# Patient Record
Sex: Female | Born: 1970 | Race: White | Hispanic: No | Marital: Married | Smoking: Never smoker
Health system: Southern US, Community
[De-identification: ages and names within clinical notes are randomized; demographics above are authoritative.]

---

## 1999-04-11 HISTORY — PX: GALLBLADDER SURGERY: SHX652

## 2012-02-20 ENCOUNTER — Ambulatory Visit: Payer: Self-pay | Admitting: Family Medicine

## 2012-02-20 LAB — LIPID PANEL
Cholesterol: 144 mg/dL (ref 0–200)
HDL Cholesterol: 46 mg/dL (ref 40–60)
Ldl Cholesterol, Calc: 84 mg/dL (ref 0–100)
Triglycerides: 72 mg/dL (ref 0–200)
VLDL Cholesterol, Calc: 14 mg/dL (ref 5–40)

## 2012-02-20 LAB — FOLATE: Folic Acid: 19.7 ng/mL (ref 3.1–100.0)

## 2012-02-20 LAB — FERRITIN: Ferritin (ARMC): 31 ng/mL (ref 8–388)

## 2015-01-13 DIAGNOSIS — Z833 Family history of diabetes mellitus: Secondary | ICD-10-CM | POA: Insufficient documentation

## 2015-01-13 DIAGNOSIS — Z789 Other specified health status: Secondary | ICD-10-CM | POA: Insufficient documentation

## 2015-01-14 ENCOUNTER — Encounter: Payer: Self-pay | Admitting: Family Medicine

## 2015-01-14 ENCOUNTER — Ambulatory Visit (INDEPENDENT_AMBULATORY_CARE_PROVIDER_SITE_OTHER): Payer: Managed Care, Other (non HMO) | Admitting: Family Medicine

## 2015-01-14 VITALS — BP 122/80 | HR 91 | Temp 98.6°F | Resp 16 | Ht 63.0 in | Wt 141.4 lb

## 2015-01-14 DIAGNOSIS — J301 Allergic rhinitis due to pollen: Secondary | ICD-10-CM

## 2015-01-14 DIAGNOSIS — J40 Bronchitis, not specified as acute or chronic: Secondary | ICD-10-CM

## 2015-01-14 DIAGNOSIS — J309 Allergic rhinitis, unspecified: Secondary | ICD-10-CM | POA: Insufficient documentation

## 2015-01-14 MED ORDER — CEFDINIR 300 MG PO CAPS: 300.0000 mg | ORAL_CAPSULE | Freq: Two times a day (BID) | ORAL | Status: DC

## 2015-01-14 NOTE — Progress Notes (Signed)
Subjective:     Patient ID: Colleen Cox, female   DOB: 1971/02/16, 44 y.o.   MRN: [id number]  HPI  Chief Complaint  Patient presents with  . Cough    Patient comes in office today with concerns of productive cough and sinus congestion. Patient reports symptoms have been greater than 2 weeks. Patient states that cough is productive of mucous and she feels a heaviness in her chest, patient reports upon standing fast she experiences visual changes. Patient has taken otc Zyrtec to relieve symptoms  States cough has become increasingly productive of thick yellowish sputum. Sinus drainage remains clear. Reports night sweats.   Review of Systems  Constitutional: Negative for fever and chills.       Objective:   Physical Exam  Constitutional: She appears well-developed and well-nourished. No distress.  Ears: T.M's intact without inflammation Throat: no tonsillar enlargement or exudate Neck: no cervical adenopathy Lungs: clear    Assessment:    1. Bronchitis - cefdinir (OMNICEF) 300 MG capsule; Take 1 capsule (300 mg total) by mouth 2 (two) times daily.  Dispense: 14 capsule; Refill: 0    Plan:    Discussed use of Delsym and Mucinex.

## 2015-01-14 NOTE — Patient Instructions (Signed)
Continue Mucinex and Delsym for cough. 

## 2015-07-15 ENCOUNTER — Ambulatory Visit (INDEPENDENT_AMBULATORY_CARE_PROVIDER_SITE_OTHER): Payer: 59 | Admitting: Physician Assistant

## 2015-07-15 ENCOUNTER — Encounter: Payer: Self-pay | Admitting: Physician Assistant

## 2015-07-15 VITALS — BP 98/60 | HR 72 | Temp 98.3°F | Resp 16 | Ht 62.0 in | Wt 140.4 lb

## 2015-07-15 DIAGNOSIS — Z833 Family history of diabetes mellitus: Secondary | ICD-10-CM

## 2015-07-15 DIAGNOSIS — Z1239 Encounter for other screening for malignant neoplasm of breast: Secondary | ICD-10-CM

## 2015-07-15 DIAGNOSIS — Z13220 Encounter for screening for lipoid disorders: Secondary | ICD-10-CM

## 2015-07-15 DIAGNOSIS — Z136 Encounter for screening for cardiovascular disorders: Secondary | ICD-10-CM | POA: Diagnosis not present

## 2015-07-15 DIAGNOSIS — R5383 Other fatigue: Secondary | ICD-10-CM

## 2015-07-15 DIAGNOSIS — Z0000 Encounter for general adult medical examination without abnormal findings: Secondary | ICD-10-CM | POA: Diagnosis not present

## 2015-07-15 DIAGNOSIS — Z124 Encounter for screening for malignant neoplasm of cervix: Secondary | ICD-10-CM | POA: Diagnosis not present

## 2015-07-15 NOTE — Patient Instructions (Signed)
Health Maintenance, Female Adopting a healthy lifestyle and getting preventive care can go a long way to promote health and wellness. Talk with your health care provider about what schedule of regular examinations is right for you. This is a good chance for you to check in with your provider about disease prevention and staying healthy. In between checkups, there are plenty of things you can do on your own. Experts have done a lot of research about which lifestyle changes and preventive measures are most likely to keep you healthy. Ask your health care provider for more information. WEIGHT AND DIET  Eat a healthy diet  Be sure to include plenty of vegetables, fruits, low-fat dairy products, and lean protein.  Do not eat a lot of foods high in solid fats, added sugars, or salt.  Get regular exercise. This is one of the most important things you can do for your health.  Most adults should exercise for at least 150 minutes each week. The exercise should increase your heart rate and make you sweat (moderate-intensity exercise).  Most adults should also do strengthening exercises at least twice a week. This is in addition to the moderate-intensity exercise.  Maintain a healthy weight  Body mass index (BMI) is a measurement that can be used to identify possible weight problems. It estimates body fat based on height and weight. Your health care provider can help determine your BMI and help you achieve or maintain a healthy weight.  For females 89 years of age and older:   A BMI below 18.5 is considered underweight.  A BMI of 18.5 to 24.9 is normal.  A BMI of 25 to 29.9 is considered overweight.  A BMI of 30 and above is considered obese.  Watch levels of cholesterol and blood lipids  You should start having your blood tested for lipids and cholesterol at 43 years of age, then have this test every 5 years.  You may need to have your cholesterol levels checked more often if:  Your lipid  or cholesterol levels are high.  You are older than 43 years of age.  You are at high risk for heart disease.  CANCER SCREENING   Lung Cancer  Lung cancer screening is recommended for adults 36-32 years old who are at high risk for lung cancer because of a history of smoking.  A yearly low-dose CT scan of the lungs is recommended for people who:  Currently smoke.  Have quit within the past 15 years.  Have at least a 30-pack-year history of smoking. A pack year is smoking an average of one pack of cigarettes a day for 1 year.  Yearly screening should continue until it has been 15 years since you quit.  Yearly screening should stop if you develop a health problem that would prevent you from having lung cancer treatment.  Breast Cancer  Practice breast self-awareness. This means understanding how your breasts normally appear and feel.  It also means doing regular breast self-exams. Let your health care provider know about any changes, no matter how small.  If you are in your 20s or 30s, you should have a clinical breast exam (CBE) by a health care provider every 1-3 years as part of a regular health exam.  If you are 75 or older, have a CBE every year. Also consider having a breast X-ray (mammogram) every year.  If you have a family history of breast cancer, talk to your health care provider about genetic screening.  If you  are at high risk for breast cancer, talk to your health care provider about having an MRI and a mammogram every year.  Breast cancer gene (BRCA) assessment is recommended for women who have family members with BRCA-related cancers. BRCA-related cancers include:  Breast.  Ovarian.  Tubal.  Peritoneal cancers.  Results of the assessment will determine the need for genetic counseling and BRCA1 and BRCA2 testing. Cervical Cancer Your health care provider may recommend that you be screened regularly for cancer of the pelvic organs (ovaries, uterus, and  vagina). This screening involves a pelvic examination, including checking for microscopic changes to the surface of your cervix (Pap test). You may be encouraged to have this screening done every 3 years, beginning at age 21.  For women ages 30-65, health care providers may recommend pelvic exams and Pap testing every 3 years, or they may recommend the Pap and pelvic exam, combined with testing for human papilloma virus (HPV), every 5 years. Some types of HPV increase your risk of cervical cancer. Testing for HPV may also be done on women of any age with unclear Pap test results.  Other health care providers may not recommend any screening for nonpregnant women who are considered low risk for pelvic cancer and who do not have symptoms. Ask your health care provider if a screening pelvic exam is right for you.  If you have had past treatment for cervical cancer or a condition that could lead to cancer, you need Pap tests and screening for cancer for at least 20 years after your treatment. If Pap tests have been discontinued, your risk factors (such as having a new sexual partner) need to be reassessed to determine if screening should resume. Some women have medical problems that increase the chance of getting cervical cancer. In these cases, your health care provider may recommend more frequent screening and Pap tests. Colorectal Cancer  This type of cancer can be detected and often prevented.  Routine colorectal cancer screening usually begins at 43 years of age and continues through 43 years of age.  Your health care provider may recommend screening at an earlier age if you have risk factors for colon cancer.  Your health care provider may also recommend using home test kits to check for hidden blood in the stool.  A small camera at the end of a tube can be used to examine your colon directly (sigmoidoscopy or colonoscopy). This is done to check for the earliest forms of colorectal  cancer.  Routine screening usually begins at age 50.  Direct examination of the colon should be repeated every 5-10 years through 43 years of age. However, you may need to be screened more often if early forms of precancerous polyps or small growths are found. Skin Cancer  Check your skin from head to toe regularly.  Tell your health care provider about any new moles or changes in moles, especially if there is a change in a mole's shape or color.  Also tell your health care provider if you have a mole that is larger than the size of a pencil eraser.  Always use sunscreen. Apply sunscreen liberally and repeatedly throughout the day.  Protect yourself by wearing long sleeves, pants, a wide-brimmed hat, and sunglasses whenever you are outside. HEART DISEASE, DIABETES, AND HIGH BLOOD PRESSURE   High blood pressure causes heart disease and increases the risk of stroke. High blood pressure is more likely to develop in:  People who have blood pressure in the high end   of the normal range (130-139/85-89 mm Hg).  People who are overweight or obese.  People who are African American.  If you are 66-47 years of age, have your blood pressure checked every 3-5 years. If you are 51 years of age or older, have your blood pressure checked every year. You should have your blood pressure measured twice--once when you are at a hospital or clinic, and once when you are not at a hospital or clinic. Record the average of the two measurements. To check your blood pressure when you are not at a hospital or clinic, you can use:  An automated blood pressure machine at a pharmacy.  A home blood pressure monitor.  If you are between 56 years and 80 years old, ask your health care provider if you should take aspirin to prevent strokes.  Have regular diabetes screenings. This involves taking a blood sample to check your fasting blood sugar level.  If you are at a normal weight and have a low risk for diabetes,  have this test once every three years after 43 years of age.  If you are overweight and have a high risk for diabetes, consider being tested at a younger age or more often. PREVENTING INFECTION  Hepatitis B  If you have a higher risk for hepatitis B, you should be screened for this virus. You are considered at high risk for hepatitis B if:  You were born in a country where hepatitis B is common. Ask your health care provider which countries are considered high risk.  Your parents were born in a high-risk country, and you have not been immunized against hepatitis B (hepatitis B vaccine).  You have HIV or AIDS.  You use needles to inject street drugs.  You live with someone who has hepatitis B.  You have had sex with someone who has hepatitis B.  You get hemodialysis treatment.  You take certain medicines for conditions, including cancer, organ transplantation, and autoimmune conditions. Hepatitis C  Blood testing is recommended for:  Everyone born from [birth year] through 1963.  Anyone with known risk factors for hepatitis C. Sexually transmitted infections (STIs)  You should be screened for sexually transmitted infections (STIs) including gonorrhea and chlamydia if:  You are sexually active and are younger than 43 years of age.  You are older than 43 years of age and your health care provider tells you that you are at risk for this type of infection.  Your sexual activity has changed since you were last screened and you are at an increased risk for chlamydia or gonorrhea. Ask your health care provider if you are at risk.  If you do not have HIV, but are at risk, it may be recommended that you take a prescription medicine daily to prevent HIV infection. This is called pre-exposure prophylaxis (PrEP). You are considered at risk if:  You are sexually active and do not regularly use condoms or know the HIV status of your partner(s).  You take drugs by injection.  You are sexually  active with a partner who has HIV. Talk with your health care provider about whether you are at high risk of being infected with HIV. If you choose to begin PrEP, you should first be tested for HIV. You should then be tested every 3 months for as long as you are taking PrEP.  PREGNANCY   If you are premenopausal and you may become pregnant, ask your health care provider about preconception counseling.  If you may  become pregnant, take 400 to 800 micrograms (mcg) of folic acid every day.  If you want to prevent pregnancy, talk to your health care provider about birth control (contraception). OSTEOPOROSIS AND MENOPAUSE   Osteoporosis is a disease in which the bones lose minerals and strength with aging. This can result in serious bone fractures. Your risk for osteoporosis can be identified using a bone density scan.  If you are 61 years of age or older, or if you are at risk for osteoporosis and fractures, ask your health care provider if you should be screened.  Ask your health care provider whether you should take a calcium or vitamin D supplement to lower your risk for osteoporosis.  Menopause may have certain physical symptoms and risks.  Hormone replacement therapy may reduce some of these symptoms and risks. Talk to your health care provider about whether hormone replacement therapy is right for you.  HOME CARE INSTRUCTIONS   Schedule regular health, dental, and eye exams.  Stay current with your immunizations.   Do not use any tobacco products including cigarettes, chewing tobacco, or electronic cigarettes.  If you are pregnant, do not drink alcohol.  If you are breastfeeding, limit how much and how often you drink alcohol.  Limit alcohol intake to no more than 1 drink per day for nonpregnant women. One drink equals 12 ounces of beer, 5 ounces of wine, or 1 ounces of hard liquor.  Do not use street drugs.  Do not share needles.  Ask your health care provider for help if  you need support or information about quitting drugs.  Tell your health care provider if you often feel depressed.  Tell your health care provider if you have ever been abused or do not feel safe at home.   This information is not intended to replace advice given to you by your health care provider. Make sure you discuss any questions you have with your health care provider.   Document Released: 10/10/2010 Document Revised: 04/17/2014 Document Reviewed: 02/26/2013 Elsevier Interactive Patient Education [employer].

## 2015-07-15 NOTE — Progress Notes (Signed)
Patient: Colleen Cox, Female    DOB: 1970/11/11, 45 y.o.   MRN: [id number] Visit Date: 07/15/2015  Today's Provider: Margaretann Loveless, PA-C   Chief Complaint  Patient presents with  . Annual Exam   Subjective:    Annual physical exam Colleen Cox is a 45 y.o. female who presents today for health maintenance and complete physical. She feels fairly well, she is having her seasonal allergies. She reports exercising- very active, "walks her dog a lot.. She reports she is sleeping well. She reports she had her mammogram about a year ago at [facility]. She needs a physical form to be fill out for her insurance.  Pap: ? 2015-[facility]-Normal Mammogram: 2016 [facility]- Normal; No family h/o breast cancer Colonoscopy: N/A Tdap: 07/19/2009 -----------------------------------------------------------------   Review of Systems  Constitutional: Negative.   HENT: Positive for congestion, rhinorrhea, sinus pressure and sneezing.   Eyes: Positive for itching.  Respiratory: Positive for cough.   Cardiovascular: Negative.   Gastrointestinal: Negative.   Endocrine: Negative.   Genitourinary: Negative.   Musculoskeletal: Negative.   Skin: Negative.   Allergic/Immunologic: Positive for environmental allergies.  Neurological: Positive for headaches.  Hematological: Negative.   Psychiatric/Behavioral: Negative.     Social History      She  reports that she has never smoked. She has never used smokeless tobacco. She reports that she drinks alcohol. She reports that she does not use illicit drugs.       Social History   Social History  . Marital Status: Married    Spouse Name: N/A  . Number of Children: N/A  . Years of Education: N/A   Social History Main Topics  . Smoking status: Never Smoker   . Smokeless tobacco: Never Used  . Alcohol Use: 0.0 oz/week    0 Standard drinks or equivalent per week     Comment: 1 glass of wine once a week  . Drug Use: No  .  Sexual Activity: Not Asked   Other Topics Concern  . None   Social History Narrative    History reviewed. No pertinent past medical history.   Patient Active Problem List   Diagnosis Date Noted  . Allergic rhinitis 01/14/2015  . Family history of diabetes mellitus 01/13/2015    Past Surgical History  Procedure Laterality Date  . Gallbladder surgery  2001    Family History        Family Status  Relation Status Death Age  . Mother Alive   . Father Alive         Her family history includes Cirrhosis in her mother; Diabetes in her father and mother; Fibromyalgia in her mother; Heart attack in her father; Heart disease in her father and mother; Liver disease in her mother.    No Known Allergies  Previous Medications   CEFDINIR (OMNICEF) 300 MG CAPSULE    Take 1 capsule (300 mg total) by mouth 2 (two) times daily.   CETIRIZINE (ZYRTEC) 10 MG TABLET    Take 10 mg by mouth daily.   CHOLECALCIFEROL 1000 UNITS TABLET    Take by mouth.   FLUTICASONE PROPIONATE (FLONASE NA)    Place 1 spray into the nose as needed.   MULTIPLE VITAMIN PO    Take by mouth.    Patient Care Team: Anola Gurney, [place] as PCP - General (Family Medicine)     Objective:   Vitals: BP 98/60 mmHg  Pulse 72  Temp(Src) 98.3 F (  36.8 C) (Oral)  Resp 16  Ht 5\' 2"  (1.575 m)  Wt 140 lb 6.4 oz (63.685 kg)  BMI 25.67 kg/m2   Physical Exam  Constitutional: She is oriented to person, place, and time. She appears well-developed and well-nourished. No distress.  HENT:  Head: Normocephalic and atraumatic.  Right Ear: Hearing, tympanic membrane, external ear and ear canal normal.  Left Ear: Hearing, tympanic membrane, external ear and ear canal normal.  Nose: Nose normal.  Mouth/Throat: Uvula is midline, oropharynx is clear and moist and mucous membranes are normal. No oropharyngeal exudate.  Eyes: Conjunctivae and EOM are normal. Pupils are equal, round, and reactive to light. Right eye exhibits no  discharge. Left eye exhibits no discharge. No scleral icterus.  Neck: Normal range of motion. Neck supple. No JVD present. Carotid bruit is not present. No tracheal deviation present. No thyromegaly present.  Cardiovascular: Normal rate, regular rhythm, normal heart sounds and intact distal pulses.  Exam reveals no gallop and no friction rub.   No murmur heard. Pulmonary/Chest: Effort normal and breath sounds normal. No respiratory distress. She has no wheezes. She has no rales. She exhibits no tenderness. Right breast exhibits no inverted nipple, no mass, no nipple discharge, no skin change and no tenderness. Left breast exhibits no inverted nipple, no mass, no nipple discharge, no skin change and no tenderness. Breasts are symmetrical.  Abdominal: Soft. Bowel sounds are normal. She exhibits no distension and no mass. There is no tenderness. There is no rebound and no guarding. Hernia confirmed negative in the right inguinal area and confirmed negative in the left inguinal area.  Genitourinary: Rectum normal, vagina normal and uterus normal. No breast swelling, tenderness, discharge or bleeding. Pelvic exam was performed with patient supine. There is no rash, tenderness, lesion or injury on the right labia. There is no rash, tenderness, lesion or injury on the left labia. Cervix exhibits no motion tenderness, no discharge and no friability. Right adnexum displays no mass, no tenderness and no fullness. Left adnexum displays no mass, no tenderness and no fullness. No erythema, tenderness or bleeding in the vagina. No signs of injury around the vagina. No vaginal discharge found.  Musculoskeletal: Normal range of motion. She exhibits no edema or tenderness.  Lymphadenopathy:    She has no cervical adenopathy.       Right: No inguinal adenopathy present.       Left: No inguinal adenopathy present.  Neurological: She is alert and oriented to person, place, and time. She has normal reflexes. No cranial nerve  deficit. Coordination normal.  Skin: Skin is warm and dry. No rash noted. She is not diaphoretic.  Psychiatric: She has a normal mood and affect. Her behavior is normal. Judgment and thought content normal.  Vitals reviewed.    Depression Screen No flowsheet data found.    Assessment & Plan:     Routine Health Maintenance and Physical Exam 1. Annual physical exam Normal physical exam today. Will check labs as below and f/u pending lab results. If labs are stable and WNL does not need repeated for 1 year at next annual physical exam. She is to call if she has any acute issue, question or concern in the meantime. - CBC with Differential/Platelet - Comprehensive metabolic panel - TSH  2. Breast cancer screening Patient prefers [facility] instead. - [facility] DIGITAL SCREENING BILATERAL; Future  3. Cervical cancer screening Pap collected today. Will f/u pending results. - Pap IG w/ reflex to HPV when ASC-U  4. Encounter for lipid screening for cardiovascular disease Will check cholesterol as below and f/u pending results. - Lipid panel  5. Family history of diabetes mellitus Will check HgBA1c as below and f/u pending results. - Hemoglobin A1c  6. Other fatigue Will check labs as below and f/u pending results. - Magnesium - Vitamin D (25 hydroxy)   Exercise Activities and Dietary recommendations Goals    None       There is no immunization history on file for this patient.  Health Maintenance  Topic Date Due  . HIV Screening  10/11/1987  . TETANUS/TDAP  10/11/1991  . PAP SMEAR  10/10/1993  . INFLUENZA VACCINE  11/09/2015      Discussed health benefits of physical activity, and encouraged her to engage in regular exercise appropriate for her age and condition.    --------------------------------------------------------------------

## 2015-07-16 ENCOUNTER — Telehealth: Payer: Self-pay

## 2015-07-16 LAB — LIPID PANEL
CHOL/HDL RATIO: 3.6 ratio (ref 0.0–4.4)
Cholesterol, Total: 194 mg/dL (ref 100–199)
HDL: 54 mg/dL (ref >39)
LDL CALC: 123 mg/dL — AB (ref 0–99)
Triglycerides: 85 mg/dL (ref 0–149)
VLDL Cholesterol Cal: 17 mg/dL (ref 5–40)

## 2015-07-16 LAB — COMPREHENSIVE METABOLIC PANEL
ALK PHOS: 54 IU/L (ref 39–117)
ALT: 25 IU/L (ref 0–32)
AST: 20 IU/L (ref 0–40)
Albumin/Globulin Ratio: 2 (ref 1.2–2.2)
Albumin: 4.5 g/dL (ref 3.5–5.5)
BUN/Creatinine Ratio: 12 (ref 9–23)
BUN: 8 mg/dL (ref 6–24)
Bilirubin Total: 0.4 mg/dL (ref 0.0–1.2)
CO2: 27 mmol/L (ref 18–29)
CREATININE: 0.69 mg/dL (ref 0.57–1.00)
Calcium: 9.3 mg/dL (ref 8.7–10.2)
Chloride: 101 mmol/L (ref 96–106)
GFR calc Af Amer: 124 mL/min/{1.73_m2} (ref >59)
GFR calc non Af Amer: 108 mL/min/{1.73_m2} (ref >59)
Globulin, Total: 2.3 g/dL (ref 1.5–4.5)
Glucose: 100 mg/dL — ABNORMAL HIGH (ref 65–99)
Potassium: 4.7 mmol/L (ref 3.5–5.2)
Sodium: 141 mmol/L (ref 134–144)
Total Protein: 6.8 g/dL (ref 6.0–8.5)

## 2015-07-16 LAB — VITAMIN D 25 HYDROXY (VIT D DEFICIENCY, FRACTURES): VIT D 25 HYDROXY: 27.1 ng/mL — AB (ref 30.0–100.0)

## 2015-07-16 LAB — MAGNESIUM: MAGNESIUM: 2.1 mg/dL (ref 1.6–2.3)

## 2015-07-16 LAB — CBC WITH DIFFERENTIAL/PLATELET
BASOS ABS: 0 10*3/uL (ref 0.0–0.2)
Basos: 0 %
EOS (ABSOLUTE): 0.1 10*3/uL (ref 0.0–0.4)
Eos: 1 %
Hematocrit: 43.6 % (ref 34.0–46.6)
Hemoglobin: 15.4 g/dL (ref 11.1–15.9)
IMMATURE GRANULOCYTES: 0 %
Immature Grans (Abs): 0 10*3/uL (ref 0.0–0.1)
LYMPHS ABS: 2.4 10*3/uL (ref 0.7–3.1)
Lymphs: 40 %
MCH: 31.6 pg (ref 26.6–33.0)
MCHC: 35.3 g/dL (ref 31.5–35.7)
MCV: 90 fL (ref 79–97)
Monocytes Absolute: 0.5 10*3/uL (ref 0.1–0.9)
Monocytes: 9 %
Neutrophils Absolute: 2.8 10*3/uL (ref 1.4–7.0)
Neutrophils: 50 %
PLATELETS: 301 10*3/uL (ref 150–379)
RBC: 4.87 x10E6/uL (ref 3.77–5.28)
RDW: 12.9 % (ref 12.3–15.4)
WBC: 5.8 10*3/uL (ref 3.4–10.8)

## 2015-07-16 LAB — TSH: TSH: 1.65 u[IU]/mL (ref 0.450–4.500)

## 2015-07-16 LAB — HEMOGLOBIN A1C
Est. average glucose Bld gHb Est-mCnc: 105 mg/dL
HEMOGLOBIN A1C: 5.3 % (ref 4.8–5.6)

## 2015-07-16 NOTE — Telephone Encounter (Signed)
-----   Message from Margaretann LovelessJennifer M Burnette, [place]PA-C sent at 07/16/2015 11:04 AM EDT ----- All labs are within normal limits and stable, with exception of Vit D which is 27.1.  Taking a daily vit d supp of 1000-2000 IU daily should help with that. Also being in sunlight without sunscreen in early morning or later evening hours can boost vit d as well. Magnesium level is normal. Will recheck in one year. Biometric form for work was filled out and faxed also.Thanks! -JB

## 2015-07-16 NOTE — Telephone Encounter (Signed)
LM to call back.  Thanks,  -Joseline 

## 2015-07-16 NOTE — Telephone Encounter (Signed)
Advised patient as below.  

## 2015-07-20 LAB — PAP IG W/ RFLX HPV ASCU: PAP SMEAR COMMENT: 0

## 2015-07-21 ENCOUNTER — Telehealth: Payer: Self-pay

## 2015-07-21 NOTE — Telephone Encounter (Signed)
Patient advised as directed below. 

## 2015-07-21 NOTE — Telephone Encounter (Signed)
LMTCB

## 2015-07-21 NOTE — Telephone Encounter (Signed)
-----   Message from Margaretann LovelessJennifer M Burnette, [place]PA-C sent at 07/21/2015  8:28 AM EDT ----- Pap is normal.  Will repeat in 3 years.

## 2015-07-23 ENCOUNTER — Encounter: Payer: Self-pay | Admitting: Family Medicine

## 2015-07-23 ENCOUNTER — Encounter: Payer: Self-pay | Admitting: Physician Assistant

## 2015-09-01 ENCOUNTER — Ambulatory Visit (INDEPENDENT_AMBULATORY_CARE_PROVIDER_SITE_OTHER): Payer: 59 | Admitting: Physician Assistant

## 2015-09-01 ENCOUNTER — Encounter: Payer: Self-pay | Admitting: Physician Assistant

## 2015-09-01 VITALS — BP 122/80 | HR 85 | Temp 98.2°F | Resp 16 | Wt 140.8 lb

## 2015-09-01 DIAGNOSIS — J40 Bronchitis, not specified as acute or chronic: Secondary | ICD-10-CM | POA: Diagnosis not present

## 2015-09-01 DIAGNOSIS — R059 Cough, unspecified: Secondary | ICD-10-CM

## 2015-09-01 DIAGNOSIS — R05 Cough: Secondary | ICD-10-CM

## 2015-09-01 MED ORDER — AZITHROMYCIN 250 MG PO TABS: ORAL_TABLET | ORAL | Status: DC

## 2015-09-01 MED ORDER — PREDNISONE 10 MG PO TABS: ORAL_TABLET | ORAL | Status: DC

## 2015-09-01 MED ORDER — HYDROCODONE-HOMATROPINE 5-1.5 MG/5ML PO SYRP: 5.0000 mL | ORAL_SOLUTION | Freq: Three times a day (TID) | ORAL | Status: DC | PRN

## 2015-09-01 NOTE — Progress Notes (Signed)
Patient: Colleen Cox Female    DOB: [birth year]   45 y.o.   MRN: [id number] Visit Date: 09/01/2015  Today's Provider: Margaretann Loveless, PA-C   Chief Complaint  Patient presents with  . Cough   Subjective:    Cough This is a new problem. Episode onset: She reports she went on vacaton 04/28 and had like the flu symptoms but the cough is still present. The problem has been gradually worsening. The problem occurs constantly. The cough is non-productive. Associated symptoms include postnasal drip, rhinorrhea and shortness of breath (when she is lying in bed and walking her dog). Pertinent negatives include no chest pain, chills, ear pain, fever, headaches, nasal congestion, sore throat or wheezing. Associated symptoms comments: Upper back pain . The symptoms are aggravated by lying down (going up the stairs she feels very weak. Fatigue). Treatments tried: Flonase and Zyrtec, mucinex. The treatment provided mild relief.       No Known Allergies Previous Medications   CEFDINIR (OMNICEF) 300 MG CAPSULE    Take 1 capsule (300 mg total) by mouth 2 (two) times daily.   CETIRIZINE (ZYRTEC) 10 MG TABLET    Take 10 mg by mouth daily.   CHOLECALCIFEROL 1000 UNITS TABLET    Take by mouth.   FLUTICASONE PROPIONATE (FLONASE NA)    Place 1 spray into the nose as needed.   MULTIPLE VITAMIN PO    Take by mouth.    Review of Systems  Constitutional: Positive for fatigue. Negative for fever and chills.  HENT: Positive for congestion, postnasal drip and rhinorrhea. Negative for ear pain, sinus pressure and sore throat.   Respiratory: Positive for cough and shortness of breath (when she is lying in bed and walking her dog). Negative for wheezing.   Cardiovascular: Negative for chest pain, palpitations and leg swelling.  Gastrointestinal: Negative for nausea, vomiting and abdominal pain.  Neurological: Negative for dizziness and headaches.    Social History  Substance Use Topics  . Smoking  status: Never Smoker   . Smokeless tobacco: Never Used  . Alcohol Use: 0.0 oz/week    0 Standard drinks or equivalent per week     Comment: 1 glass of wine once a week   Objective:   BP 122/80 mmHg  Pulse 85  Temp(Src) 98.2 F (36.8 C) (Oral)  Resp 16  Wt 140 lb 12.8 oz (63.866 kg)  SpO2 96%  LMP 08/06/2015  Physical Exam  Constitutional: She appears well-developed and well-nourished. No distress.  HENT:  Head: Normocephalic and atraumatic.  Right Ear: Hearing, tympanic membrane, external ear and ear canal normal.  Left Ear: Hearing, tympanic membrane, external ear and ear canal normal.  Nose: Mucosal edema and rhinorrhea present. Right sinus exhibits no maxillary sinus tenderness and no frontal sinus tenderness. Left sinus exhibits no maxillary sinus tenderness and no frontal sinus tenderness.  Mouth/Throat: Uvula is midline and mucous membranes are normal. Posterior oropharyngeal erythema present. No oropharyngeal exudate or posterior oropharyngeal edema.  Eyes: Conjunctivae are normal. Pupils are equal, round, and reactive to light. Right eye exhibits no discharge. Left eye exhibits no discharge. No scleral icterus.  Neck: Normal range of motion. Neck supple. No tracheal deviation present. No thyromegaly present.  Cardiovascular: Normal rate, regular rhythm and normal heart sounds.  Exam reveals no gallop and no friction rub.   No murmur heard. Pulmonary/Chest: Effort normal. No stridor. No respiratory distress. She has wheezes (throughout). She has no rales.  Lymphadenopathy:  She has no cervical adenopathy.  Skin: Skin is warm and dry. She is not diaphoretic.  Vitals reviewed.       Assessment & Plan:     1. Bronchitis Worsening symptoms that have not responded to over-the-counter medications over 2 weeks. I will give her azithromycin and prednisone taper as below for treatment. I will also give Hycodan cough syrup for nighttime cough. I did advise of drowsiness  precautions. She is to continue the Mucinex during the day for congestion. She is to stay well-hydrated and try to get plenty of rest. She is to call the office if symptoms fail to improve or worsen. - azithromycin (ZITHROMAX) 250 MG tablet; Take 2 tablets PO on day one, and one tablet PO daily thereafter until completed.  Dispense: 6 tablet; Refill: 0 - predniSONE (DELTASONE) 10 MG tablet; Take 6 tabs PO on day 1&2, 5 tabs PO on day 3&4, 4 tabs PO on day 5&6, 3 tabs PO on day 7&8, 2 tabs PO on day 9&10, 1 tab PO on day 11&12.  Dispense: 42 tablet; Refill: 0  2. Cough See above medical treatment plan. - HYDROcodone-homatropine (HYCODAN) 5-1.5 MG/5ML syrup; Take 5 mLs by mouth every 8 (eight) hours as needed.  Dispense: 180 mL; Refill: 0       Margaretann LovelessJennifer M Twila Rappa, PA-C  [facility]

## 2015-09-01 NOTE — Patient Instructions (Signed)

## 2015-09-03 ENCOUNTER — Ambulatory Visit: Payer: 59 | Admitting: Family Medicine

## 2016-02-02 ENCOUNTER — Encounter: Payer: Self-pay | Admitting: Physician Assistant

## 2016-02-02 ENCOUNTER — Ambulatory Visit (INDEPENDENT_AMBULATORY_CARE_PROVIDER_SITE_OTHER): Payer: 59 | Admitting: Physician Assistant

## 2016-02-02 VITALS — BP 122/80 | HR 84 | Temp 98.0°F | Resp 16 | Wt 140.0 lb

## 2016-02-02 DIAGNOSIS — R350 Frequency of micturition: Secondary | ICD-10-CM

## 2016-02-02 DIAGNOSIS — R05 Cough: Secondary | ICD-10-CM | POA: Diagnosis not present

## 2016-02-02 DIAGNOSIS — R059 Cough, unspecified: Secondary | ICD-10-CM

## 2016-02-02 LAB — POCT URINALYSIS DIPSTICK
Bilirubin, UA: NEGATIVE
Blood, UA: NEGATIVE
Glucose, UA: NEGATIVE
Ketones, UA: NEGATIVE
Leukocytes, UA: NEGATIVE
Nitrite, UA: NEGATIVE
Protein, UA: NEGATIVE
Spec Grav, UA: >=1.03
Urobilinogen, UA: 0.2
pH, UA: 5

## 2016-02-02 LAB — POCT GLYCOSYLATED HEMOGLOBIN (HGB A1C): Hemoglobin A1C: 5.1

## 2016-02-02 MED ORDER — HYDROCODONE-HOMATROPINE 5-1.5 MG/5ML PO SYRP: 5.0000 mL | ORAL_SOLUTION | Freq: Every day | ORAL | 0 refills | Status: DC

## 2016-02-02 NOTE — Addendum Note (Signed)
Addended by: Kavin LeechWALSH, LAURA E on: 02/02/2016 09:17 AM   Modules accepted: Orders

## 2016-02-02 NOTE — Patient Instructions (Signed)
Urinary Frequency °The number of times a normal person urinates depends upon how much liquid they take in and how much liquid they are losing. If the temperature is hot and there is high humidity, then the person will sweat more and usually breathe a little more frequently. These factors decrease the amount of frequency of urination that would be considered normal. °The amount you drink is easily determined, but the amount of fluid lost is sometimes more difficult to calculate.  °Fluid is lost in two ways: °· Sensible fluid loss is usually measured by the amount of urine that you get rid of. Losses of fluid can also occur with diarrhea. °· Insensible fluid loss is more difficult to measure. It is caused by evaporation. Insensible loss of fluid occurs through breathing and sweating. It usually ranges from a little less than a quart to a little more than a quart of fluid a day. °In normal temperatures and activity levels, the average person may urinate 4 to 7 times in a 24-hour period. Needing to urinate more often than that could indicate a problem. If one urinates 4 to 7 times in 24 hours and has large volumes each time, that could indicate a different problem from one who urinates 4 to 7 times a day and has small volumes. The time of urinating is also important. Most urinating should be done during the waking hours. Getting up at night to urinate frequently can indicate some problems. °CAUSES  °The bladder is the organ in your lower abdomen that holds urine. Like a balloon, it swells some as it fills up. Your nerves sense this and tell you it is time to head for the bathroom. There are a number of reasons that you might feel the need to urinate more often than usual. They include: °· Urinary tract infection. This is usually associated with other signs such as burning when you urinate. °· In men, problems with the prostate (a walnut-size gland that is located near the tube that carries urine out of your body). There  are two reasons why the prostate can cause an increased frequency of urination: °¨ An enlarged prostate that does not let the bladder empty well. If the bladder only half empties when you urinate, then it only has half the capacity to fill before you have to urinate again. °¨ The nerves in the bladder become more hypersensitive with an increased size of the prostate even if the bladder empties completely. °· Pregnancy. °· Obesity. Excess weight is more likely to cause a problem for women than for men. °· Bladder stones or other bladder problems. °· Caffeine. °· Alcohol. °· Medications. For example, drugs that help the body get rid of extra fluid (diuretics) increase urine production. Some other medicines must be taken with lots of fluids. °· Muscle or nerve weakness. This might be the result of a spinal cord injury, a stroke, multiple sclerosis, or Parkinson disease. °· Long-standing diabetes can decrease the sensation of the bladder. This loss of sensation makes it harder to sense the bladder needs to be emptied. Over a period of years, the bladder is stretched out by constant overfilling. This weakens the bladder muscles so that the bladder does not empty well and has less capacity to fill with new urine. °· Interstitial cystitis (also called painful bladder syndrome). This condition develops because the tissues that line the inside of the bladder are inflamed (inflammation is the body's way of reacting to injury or infection). It causes pain and frequent   urination. It occurs in women more often than in men. °DIAGNOSIS  °· To decide what might be causing your urinary frequency, your health care provider will probably: °¨ Ask about symptoms you have noticed. °¨ Ask about your overall health. This will include questions about any medications you are taking. °¨ Do a physical examination. °· Order some tests. These might include: °¨ A blood test to check for diabetes or other health issues that could be contributing  to the problem. °¨ Urine testing. This could measure the flow of urine and the pressure on the bladder. °¨ A test of your neurological system (the brain, spinal cord, and nerves). This is the system that senses the need to urinate. °¨ A bladder test to check whether it is emptying completely when you urinate. °¨ Cystoscopy. This test uses a thin tube with a tiny camera on it. It offers a look inside your urethra and bladder to see if there are problems. °¨ Imaging tests. You might be given a contrast dye and then asked to urinate. X-rays are taken to see how your bladder is working. °TREATMENT  °It is important for you to be evaluated to determine if the amount or frequency that you have is unusual or abnormal. If it is found to be abnormal, the cause should be determined and this can usually be found out easily. Depending upon the cause, treatment could include medication, stimulation of the nerves, or surgery. °There are not too many things that you can do as an individual to change your urinary frequency. It is important that you balance the amount of fluid intake needed to compensate for your activity and the temperature. Medical problems will be diagnosed and taken care of by your physician. There is no particular bladder training such as Kegel exercises that you can do to help urinary frequency. This is an exercise that is usually recommended for people who have leaking of urine when they laugh, cough, or sneeze. °HOME CARE INSTRUCTIONS  °· Take any medications your health care provider prescribed or suggested. Follow the directions carefully. °· Practice any lifestyle changes that are recommended. These might include: °¨ Drinking less fluid or drinking at different times of the day. If you need to urinate often during the night, for example, you may need to stop drinking fluids early in the evening. °¨ Cutting down on caffeine or alcohol. They both can make you need to urinate more often than normal. Caffeine  is found in coffee, tea, and sodas. °¨ Losing weight, if that is recommended. °· Keep a journal or a log. You might be asked to record how much you drink and when and where you feel the need to urinate. This will also help evaluate how well the treatment provided by your physician is working. °SEEK MEDICAL CARE IF:  °· Your need to urinate often gets worse. °· You feel increased pain or irritation when you urinate. °· You notice blood in your urine. °· You have questions about any medications that your health care provider recommended. °· You notice blood, pus, or swelling at the site of any test or treatment procedure. °· You develop a fever of more than 100.5°F (38.1°C). °SEEK IMMEDIATE MEDICAL CARE IF:  °You develop a fever of more than 102.0°F (38.9°C). °  °This information is not intended to replace advice given to you by your health care provider. Make sure you discuss any questions you have with your health care provider. °  °Document Released: 01/21/2009 Document Revised:   04/17/2014 Document Reviewed: 01/21/2009 °[employer] Interactive Patient Education ©2016 [employer] ° °

## 2016-02-02 NOTE — Progress Notes (Signed)
[facility]  Chief Complaint  Patient presents with  . Urinary Tract Infection    Started about three weeks ago.     Subjective:    Patient ID: Colleen Cox, female    DOB: 01-Jan-1971, 45 y.o.   MRN: [id number]   Urinary Tract Infection: Patient complains of dysuria, frequency, hesitancy and incontinence She has had symptoms for 3 weeks. Patient also complains of congestion and cough. Patient denies fever. Patient does have a history of recurrent UTI.  Patient does not have a history of pyelonephritis or other renal issues. Patient reports vaginal discharge but only a small amount and denies new sexual partners. The patient denies recent travel outside of the [place]. Has urge to go to bathroom every hour or every 2 hours with incomplete emptying. No blood in urine. Never smoker. One sexual partner for 23 years, no concerns for STD. One 20 year old child. No feeling of fullness. Patient experiences some incontinence at night when coughing.    Upper Respiratory Infection: Patient complains of symptoms of a URI. Symptoms include cough and sore throat. Onset of symptoms was 2 weeks ago, gradually worsening since that time. She also c/o cough described as productive for the past 2 weeks .  She is drinking plenty of fluids. Evaluation to date: none. Treatment to date: Zyrtec. Patient has history of allergies. No fevers, malaise, myalgias.     Review of Systems  Constitutional: Positive for chills and malaise/fatigue. Negative for diaphoresis, fever and weight loss.  HENT: Positive for congestion and sore throat. Negative for ear discharge, ear pain, hearing loss, nosebleeds and tinnitus.   Eyes: Positive for discharge. Negative for blurred vision, double vision, photophobia, pain and redness.  Respiratory: Positive for cough, sputum production, shortness of breath and wheezing. Negative for hemoptysis.   Cardiovascular: Negative.   Gastrointestinal: Negative.    Genitourinary: Positive for dysuria, frequency and urgency. Negative for flank pain and hematuria.  Neurological: Positive for headaches. Negative for weakness.  Endo/Heme/Allergies: Positive for environmental allergies.       Objective:   BP 122/80 (BP Location: Left Arm, Patient Position: Sitting, Cuff Size: Normal)   Pulse 84   Temp 98 F (36.7 C) (Oral)   Resp 16   Wt 140 lb (63.5 kg)   LMP 01/12/2016   BMI 25.61 kg/m   Patient Active Problem List   Diagnosis Date Noted  . Allergic rhinitis 01/14/2015  . Family history of diabetes mellitus 01/13/2015    Outpatient Encounter Prescriptions as of 02/02/2016  Medication Sig Note  . cetirizine (ZYRTEC) 10 MG tablet Take 10 mg by mouth daily.   . Cholecalciferol 1000 UNITS tablet Take by mouth. 01/13/2015: Received from: [facility]  . MULTIPLE VITAMIN PO Take by mouth. 01/13/2015: Received from: [facility]  . Fluticasone Propionate (FLONASE NA) Place 1 spray into the nose as needed.   Marland Kitchen HYDROcodone-homatropine (HYCODAN) 5-1.5 MG/5ML syrup Take 5 mLs by mouth at bedtime.   . [DISCONTINUED] azithromycin (ZITHROMAX) 250 MG tablet Take 2 tablets PO on day one, and one tablet PO daily thereafter until completed.   . [DISCONTINUED] HYDROcodone-homatropine (HYCODAN) 5-1.5 MG/5ML syrup Take 5 mLs by mouth every 8 (eight) hours as needed.   . [DISCONTINUED] predniSONE (DELTASONE) 10 MG tablet Take 6 tabs PO on day 1&2, 5 tabs PO on day 3&4, 4 tabs PO on day 5&6, 3 tabs PO on day 7&8, 2 tabs PO on day 9&10, 1 tab PO on day 11&12.  No facility-administered encounter medications on file as of 02/02/2016.     No Known Allergies     Physical Exam  Constitutional: She is oriented to person, place, and time. She appears well-developed and well-nourished. No distress.  HENT:  Right Ear: External ear normal.  Left Ear: External ear normal.  Nose: Nose normal. Right sinus exhibits no maxillary sinus  tenderness and no frontal sinus tenderness. Left sinus exhibits no maxillary sinus tenderness and no frontal sinus tenderness.  Mouth/Throat: Oropharynx is clear and moist. No oropharyngeal exudate.  Eyes: Conjunctivae are normal.  Neck: Normal range of motion. Neck supple.  Cardiovascular: Normal rate and regular rhythm.   Pulmonary/Chest: Effort normal and breath sounds normal. No respiratory distress. She has no wheezes. She has no rales. She exhibits no tenderness.  Abdominal: Soft. Bowel sounds are normal. She exhibits no distension. There is no tenderness. There is no rebound, no guarding and no CVA tenderness.  Lymphadenopathy:    She has no cervical adenopathy.  Neurological: She is alert and oriented to person, place, and time.  Skin: Skin is warm and dry. She is not diaphoretic.  Psychiatric: She has a normal mood and affect. Her behavior is normal.       Assessment & Plan:   Problem List Items Addressed This Visit    None    Visit Diagnoses    Cough    -  Primary   Relevant Medications   HYDROcodone-homatropine (HYCODAN) 5-1.5 MG/5ML syrup   Urinary frequency          1. Ordered in office urinalysis with culture.   Urinalysis No results found for: COLORURINE, APPEARANCEUR, LABSPEC, PHURINE, GLUCOSEU, HGBUR, BILIRUBINUR, KETONESUR, PROTEINUR, UROBILINOGEN, NITRITE, LEUKOCYTESUR  2. No signs of infection on urinalysis. Will await culture. No antibiotics for now. Advised patient that she is unlikely as a premenopausal woman to have vaginal atrophy causing irritative symptoms. Low suspicion for any sort of prolapse or STI. Counseled patient on referral to urology if symptoms persist. Patient has family history of diabetes. POCT A1C in office today was normal at 5.1. Will prescribe cough syrup at night for URI.    3. Patient should report back to clinic or to emergency room should they experience extreme low back pain, fever, nausea, vomiting.   Patient Instructions   Urinary Frequency The number of times a normal person urinates depends upon how much liquid they take in and how much liquid they are losing. If the temperature is hot and there is high humidity, then the person will sweat more and usually breathe a little more frequently. These factors decrease the amount of frequency of urination that would be considered normal. The amount you drink is easily determined, but the amount of fluid lost is sometimes more difficult to calculate.  Fluid is lost in two ways:  Sensible fluid loss is usually measured by the amount of urine that you get rid of. Losses of fluid can also occur with diarrhea.  Insensible fluid loss is more difficult to measure. It is caused by evaporation. Insensible loss of fluid occurs through breathing and sweating. It usually ranges from a little less than a quart to a little more than a quart of fluid a day. In normal temperatures and activity levels, the average person may urinate 4 to 7 times in a 24-hour period. Needing to urinate more often than that could indicate a problem. If one urinates 4 to 7 times in 24 hours and has large volumes each time, that  could indicate a different problem from one who urinates 4 to 7 times a day and has small volumes. The time of urinating is also important. Most urinating should be done during the waking hours. Getting up at night to urinate frequently can indicate some problems. CAUSES  The bladder is the organ in your lower abdomen that holds urine. Like a balloon, it swells some as it fills up. Your nerves sense this and tell you it is time to head for the bathroom. There are a number of reasons that you might feel the need to urinate more often than usual. They include:  Urinary tract infection. This is usually associated with other signs such as burning when you urinate.  In men, problems with the prostate (a walnut-size gland that is located near the tube that carries urine out of your body).  There are two reasons why the prostate can cause an increased frequency of urination:  An enlarged prostate that does not let the bladder empty well. If the bladder only half empties when you urinate, then it only has half the capacity to fill before you have to urinate again.  The nerves in the bladder become more hypersensitive with an increased size of the prostate even if the bladder empties completely.  Pregnancy.  Obesity. Excess weight is more likely to cause a problem for women than for men.  Bladder stones or other bladder problems.  Caffeine.  Alcohol.  Medications. For example, drugs that help the body get rid of extra fluid (diuretics) increase urine production. Some other medicines must be taken with lots of fluids.  Muscle or nerve weakness. This might be the result of a spinal cord injury, a stroke, multiple sclerosis, or Parkinson disease.  Long-standing diabetes can decrease the sensation of the bladder. This loss of sensation makes it harder to sense the bladder needs to be emptied. Over a period of years, the bladder is stretched out by constant overfilling. This weakens the bladder muscles so that the bladder does not empty well and has less capacity to fill with new urine.  Interstitial cystitis (also called painful bladder syndrome). This condition develops because the tissues that line the inside of the bladder are inflamed (inflammation is the body's way of reacting to injury or infection). It causes pain and frequent urination. It occurs in women more often than in men. DIAGNOSIS   To decide what might be causing your urinary frequency, your health care provider will probably:  Ask about symptoms you have noticed.  Ask about your overall health. This will include questions about any medications you are taking.  Do a physical examination.  Order some tests. These might include:  A blood test to check for diabetes or other health issues that could be  contributing to the problem.  Urine testing. This could measure the flow of urine and the pressure on the bladder.  A test of your neurological system (the brain, spinal cord, and nerves). This is the system that senses the need to urinate.  A bladder test to check whether it is emptying completely when you urinate.  Cystoscopy. This test uses a thin tube with a tiny camera on it. It offers a look inside your urethra and bladder to see if there are problems.  Imaging tests. You might be given a contrast dye and then asked to urinate. X-rays are taken to see how your bladder is working. TREATMENT  It is important for you to be evaluated to determine if the  amount or frequency that you have is unusual or abnormal. If it is found to be abnormal, the cause should be determined and this can usually be found out easily. Depending upon the cause, treatment could include medication, stimulation of the nerves, or surgery. There are not too many things that you can do as an individual to change your urinary frequency. It is important that you balance the amount of fluid intake needed to compensate for your activity and the temperature. Medical problems will be diagnosed and taken care of by your physician. There is no particular bladder training such as Kegel exercises that you can do to help urinary frequency. This is an exercise that is usually recommended for people who have leaking of urine when they laugh, cough, or sneeze. HOME CARE INSTRUCTIONS   Take any medications your health care provider prescribed or suggested. Follow the directions carefully.  Practice any lifestyle changes that are recommended. These might include:  Drinking less fluid or drinking at different times of the day. If you need to urinate often during the night, for example, you may need to stop drinking fluids early in the evening.  Cutting down on caffeine or alcohol. They both can make you need to urinate more often than  normal. Caffeine is found in coffee, tea, and sodas.  Losing weight, if that is recommended.  Keep a journal or a log. You might be asked to record how much you drink and when and where you feel the need to urinate. This will also help evaluate how well the treatment provided by your physician is working. SEEK MEDICAL CARE IF:   Your need to urinate often gets worse.  You feel increased pain or irritation when you urinate.  You notice blood in your urine.  You have questions about any medications that your health care provider recommended.  You notice blood, pus, or swelling at the site of any test or treatment procedure.  You develop a fever of more than 100.51F (38.1C). SEEK IMMEDIATE MEDICAL CARE IF:  You develop a fever of more than 102.27F (38.9C).   This information is not intended to replace advice given to you by your health care provider. Make sure you discuss any questions you have with your health care provider.   Document Released: 01/21/2009 Document Revised: 04/17/2014 Document Reviewed: 01/21/2009 Elsevier Interactive Patient Education [employer]2016 Elsevier Inc.

## 2016-02-04 ENCOUNTER — Other Ambulatory Visit: Payer: Self-pay | Admitting: Physician Assistant

## 2016-02-04 DIAGNOSIS — N3090 Cystitis, unspecified without hematuria: Secondary | ICD-10-CM

## 2016-02-04 LAB — URINE CULTURE

## 2016-02-04 MED ORDER — SULFAMETHOXAZOLE-TRIMETHOPRIM 800-160 MG PO TABS: 1.0000 | ORAL_TABLET | Freq: Two times a day (BID) | ORAL | 0 refills | Status: AC

## 2016-02-04 NOTE — Progress Notes (Signed)
Urinalysis was negative in office, but culture indicated UTI. Sensitive to Bactrim, will prescribe Bactrim 800/160 twice daily for three days.

## 2016-02-07 ENCOUNTER — Telehealth: Payer: Self-pay

## 2016-02-07 NOTE — Telephone Encounter (Signed)
LMTCB 02/07/2016  Thanks,   -Vernona RiegerLaura

## 2016-02-07 NOTE — Telephone Encounter (Signed)
Pt advised.   Thanks,   -Everett Ricciardelli  

## 2017-01-03 ENCOUNTER — Encounter: Payer: Self-pay | Admitting: Physician Assistant

## 2017-01-03 ENCOUNTER — Ambulatory Visit (INDEPENDENT_AMBULATORY_CARE_PROVIDER_SITE_OTHER): Payer: 59 | Admitting: Physician Assistant

## 2017-01-03 VITALS — BP 120/70 | HR 86 | Temp 98.6°F | Resp 16 | Ht 62.0 in | Wt 142.6 lb

## 2017-01-03 DIAGNOSIS — Z0189 Encounter for other specified special examinations: Secondary | ICD-10-CM

## 2017-01-03 DIAGNOSIS — Z008 Encounter for other general examination: Secondary | ICD-10-CM

## 2017-01-03 DIAGNOSIS — Z23 Encounter for immunization: Secondary | ICD-10-CM

## 2017-01-03 DIAGNOSIS — Z1239 Encounter for other screening for malignant neoplasm of breast: Secondary | ICD-10-CM

## 2017-01-03 DIAGNOSIS — Z1231 Encounter for screening mammogram for malignant neoplasm of breast: Secondary | ICD-10-CM

## 2017-01-03 DIAGNOSIS — Z0000 Encounter for general adult medical examination without abnormal findings: Secondary | ICD-10-CM

## 2017-01-03 DIAGNOSIS — Z833 Family history of diabetes mellitus: Secondary | ICD-10-CM

## 2017-01-03 NOTE — Progress Notes (Signed)
Patient: Colleen Cox, Female    DOB: 1971-02-04, 46 y.o.   MRN: [id number] Visit Date: 01/03/2017  Today's Provider: Margaretann Loveless, PA-C   No chief complaint on file.  Subjective:    Annual physical exam Colleen Cox is a 46 y.o. female who presents today for health maintenance and complete physical. She feels well. She reports exercising, walks a lot. She reports she is sleeping well. -----------------------------------------------------------------   Review of Systems  Constitutional: Positive for appetite change and fatigue.  HENT: Negative.   Eyes: Negative.   Respiratory: Negative.   Cardiovascular: Negative.   Gastrointestinal: Negative.   Endocrine: Negative.   Genitourinary: Negative.   Musculoskeletal: Negative.   Skin: Negative.   Allergic/Immunologic: Negative.   Neurological: Negative.   Hematological: Negative.   Psychiatric/Behavioral: Negative.   Appetite change and fatigue are associated with drive to [facility] for work only.  Social History      She  reports that she has never smoked. She has never used smokeless tobacco. She reports that she drinks alcohol. She reports that she does not use drugs.       Social History   Social History  . Marital status: Married    Spouse name: N/A  . Number of children: N/A  . Years of education: N/A   Social History Main Topics  . Smoking status: Never Smoker  . Smokeless tobacco: Never Used  . Alcohol use 0.0 oz/week     Comment: 1 glass of wine once a week  . Drug use: No  . Sexual activity: Not Asked   Other Topics Concern  . None   Social History Narrative  . None    History reviewed. No pertinent past medical history.   Patient Active Problem List   Diagnosis Date Noted  . Allergic rhinitis 01/14/2015  . Family history of diabetes mellitus 01/13/2015    Past Surgical History:  Procedure Laterality Date  . GALLBLADDER SURGERY  2001    Family History        Family Status    Relation Status  . Mother Alive  . Father Alive        Her family history includes Cirrhosis in her mother; Diabetes in her father and mother; Fibromyalgia in her mother; Heart attack in her father; Heart disease in her father and mother; Liver disease in her mother.     No Known Allergies   Current Outpatient Prescriptions:  .  cetirizine (ZYRTEC) 10 MG tablet, Take 10 mg by mouth daily., Disp: , Rfl:  .  MULTIPLE VITAMIN PO, Take by mouth., Disp: , Rfl:  .  Fluticasone Propionate (FLONASE NA), Place 1 spray into the nose as needed., Disp: , Rfl:    Patient Care Team: Anola Gurney, PA as PCP - General (Family Medicine)      Objective:   Vitals: BP 120/70 (BP Location: Left Arm, Patient Position: Sitting, Cuff Size: Normal)   Pulse 86   Temp 98.6 F (37 C) (Oral)   Resp 16   Ht  (1.575 m)   Wt 142 lb 9.6 oz (64.7 kg)   BMI 26.08 kg/m    Physical Exam  Constitutional: She is oriented to person, place, and time. She appears well-developed and well-nourished. No distress.  HENT:  Head: Normocephalic and atraumatic.  Right Ear: Hearing, tympanic membrane, external ear and ear canal normal.  Left Ear: Hearing, tympanic membrane, external ear and ear canal normal.  Nose: Nose normal.  Mouth/Throat: Uvula is midline, oropharynx is clear and moist and mucous membranes are normal. No oropharyngeal exudate.  Eyes: Pupils are equal, round, and reactive to light. Conjunctivae and EOM are normal. Right eye exhibits no discharge. Left eye exhibits no discharge. No scleral icterus.  Neck: Normal range of motion. Neck supple. No JVD present. No tracheal deviation present. No thyromegaly present.  Cardiovascular: Normal rate, regular rhythm, normal heart sounds and intact distal pulses.  Exam reveals no gallop and no friction rub.   No murmur heard. Pulmonary/Chest: Effort normal and breath sounds normal. No respiratory distress. She has no wheezes. She has no rales. She exhibits  no tenderness. Right breast exhibits no inverted nipple, no mass, no nipple discharge, no skin change and no tenderness. Left breast exhibits no inverted nipple, no mass, no nipple discharge, no skin change and no tenderness. Breasts are symmetrical.  Abdominal: Soft. Bowel sounds are normal. She exhibits no distension and no mass. There is no tenderness. There is no rebound and no guarding.  Musculoskeletal: Normal range of motion. She exhibits no edema or tenderness.  Lymphadenopathy:    She has no cervical adenopathy.  Neurological: She is alert and oriented to person, place, and time. She has normal reflexes.  Skin: Skin is warm and dry. No rash noted. She is not diaphoretic.  Psychiatric: She has a normal mood and affect. Her behavior is normal. Judgment and thought content normal.  Vitals reviewed.    Depression Screen PHQ 2/9 Scores 01/03/2017  PHQ - 2 Score 0      Assessment & Plan:     Routine Health Maintenance and Physical Exam  Exercise Activities and Dietary recommendations Goals    None      Immunization History  Administered Date(s) Administered  . Tdap 07/19/2009    Health Maintenance  Topic Date Due  . HIV Screening  10/11/1987  . MAMMOGRAM  07/19/2016  . INFLUENZA VACCINE  11/08/2016  . PAP SMEAR  07/15/2018  . TETANUS/TDAP  07/20/2019     Discussed health benefits of physical activity, and encouraged her to engage in regular exercise appropriate for her age and condition.    1. Annual physical exam Normal physical exam today. Will check labs as below and f/u pending lab results. If labs are stable and WNL she will not need to have these rechecked for one year at her next annual physical exam. She is to call the office in the meantime if she has any acute issue, questions or concerns. - CBC with Differential/Platelet - TSH  2. Breast cancer screening Breast exam today was normal. There is no family history of breast cancer. She does perform  regular self breast exams. Mammogram was ordered as below. Information for [facility] was given to patient so she may schedule her mammogram at her convenience.  3. Family history of diabetes mellitus Will check labs as below and f/u pending results. - HgB A1c  4. Encounter for biometric screening Will check labs as below and f/u pending results. Will complete form and send for patient.  - Comprehensive metabolic panel - Lipid panel - HgB A1c  5. Need for influenza vaccination Flu vaccine given today without complication. Patient sat upright for 15 minutes to check for adverse reaction before being released. - Flu Vaccine QUAD 36+ mos IM  6. Need for hepatitis A immunization Hep A Vaccine given to patient without complications. Patient sat for 15 minutes after administration and was tolerated well without adverse effects. -  Hepatitis A vaccine adult IM  --------------------------------------------------------------------    Margaretann Loveless, PA-C  [facility]

## 2017-01-03 NOTE — Patient Instructions (Signed)

## 2017-01-04 ENCOUNTER — Telehealth: Payer: Self-pay

## 2017-01-04 LAB — CBC WITH DIFFERENTIAL/PLATELET
Basophils Absolute: 51 cells/uL (ref 0–200)
Basophils Relative: 0.7 %
EOS PCT: 1.7 %
Eosinophils Absolute: 124 cells/uL (ref 15–500)
HEMATOCRIT: 44.2 % (ref 35.0–45.0)
Hemoglobin: 15.2 g/dL (ref 11.7–15.5)
LYMPHS ABS: 2460 {cells}/uL (ref 850–3900)
MCH: 30.6 pg (ref 27.0–33.0)
MCHC: 34.4 g/dL (ref 32.0–36.0)
MCV: 88.9 fL (ref 80.0–100.0)
MPV: 9 fL (ref 7.5–12.5)
Monocytes Relative: 11.2 %
NEUTROS ABS: 3847 {cells}/uL (ref 1500–7800)
NEUTROS PCT: 52.7 %
PLATELETS: 280 10*3/uL (ref 140–400)
RBC: 4.97 10*6/uL (ref 3.80–5.10)
RDW: 12 % (ref 11.0–15.0)
Total Lymphocyte: 33.7 %
WBC: 7.3 10*3/uL (ref 3.8–10.8)
WBCMIX: 818 {cells}/uL (ref 200–950)

## 2017-01-04 LAB — LIPID PANEL
CHOL/HDL RATIO: 3.7 (calc) (ref <5.0)
Cholesterol: 176 mg/dL (ref <200)
HDL: 48 mg/dL — ABNORMAL LOW (ref >50)
LDL CHOLESTEROL (CALC): 108 mg/dL — AB
Non-HDL Cholesterol (Calc): 128 mg/dL (calc) (ref <130)
Triglycerides: 100 mg/dL (ref <150)

## 2017-01-04 LAB — COMPREHENSIVE METABOLIC PANEL
AG Ratio: 2 (calc) (ref 1.0–2.5)
ALBUMIN MSPROF: 4.4 g/dL (ref 3.6–5.1)
ALT: 21 U/L (ref 6–29)
AST: 16 U/L (ref 10–30)
Alkaline phosphatase (APISO): 51 U/L (ref 33–115)
BUN: 13 mg/dL (ref 7–25)
CO2: 26 mmol/L (ref 20–32)
CREATININE: 0.72 mg/dL (ref 0.50–1.10)
Calcium: 8.9 mg/dL (ref 8.6–10.2)
Chloride: 104 mmol/L (ref 98–110)
Globulin: 2.2 g/dL (calc) (ref 1.9–3.7)
Glucose, Bld: 88 mg/dL (ref 65–99)
POTASSIUM: 4.2 mmol/L (ref 3.5–5.3)
SODIUM: 138 mmol/L (ref 135–146)
TOTAL PROTEIN: 6.6 g/dL (ref 6.1–8.1)
Total Bilirubin: 0.5 mg/dL (ref 0.2–1.2)

## 2017-01-04 LAB — TSH: TSH: 2.01 mIU/L

## 2017-01-04 LAB — HEMOGLOBIN A1C
HEMOGLOBIN A1C: 4.9 %{Hb} (ref <5.7)
MEAN PLASMA GLUCOSE: 94 (calc)
eAG (mmol/L): 5.2 (calc)

## 2017-01-04 NOTE — Telephone Encounter (Signed)
LMTCB-KW 

## 2017-01-04 NOTE — Telephone Encounter (Signed)
-----   Message from Jennifer M Burnette, PA-C sent at 01/04/2017  8:26 AM EDT ----- All labs are within normal limits and stable.  Thanks! -JB 

## 2017-01-09 ENCOUNTER — Telehealth: Payer: Self-pay

## 2017-01-09 NOTE — Telephone Encounter (Signed)
Patient advised.

## 2017-01-09 NOTE — Telephone Encounter (Signed)
Patient was advised of her recent lab results. Patient wants to know if we faxed the form that she brought in during her physical. She says that the lab results needed to be filled in on the form.

## 2017-01-10 NOTE — Telephone Encounter (Signed)
I am certain I completed the form.  Josie do you remember if I gave to you.

## 2017-01-10 NOTE — Telephone Encounter (Signed)
Form was faxed by another CMA on 01/05/17.  Thanks,  -Jaxton Casale

## 2017-01-12 NOTE — Telephone Encounter (Signed)
Tried calling patient to inform but no answer.  Thanks,  -Najwa Spillane

## 2017-01-17 LAB — HM MAMMOGRAPHY

## 2018-01-10 ENCOUNTER — Ambulatory Visit (INDEPENDENT_AMBULATORY_CARE_PROVIDER_SITE_OTHER): Payer: Managed Care, Other (non HMO) | Admitting: Physician Assistant

## 2018-01-10 ENCOUNTER — Encounter: Payer: Self-pay | Admitting: Physician Assistant

## 2018-01-10 VITALS — BP 128/80 | HR 85 | Resp 16 | Ht 63.0 in | Wt 147.0 lb

## 2018-01-10 DIAGNOSIS — Z833 Family history of diabetes mellitus: Secondary | ICD-10-CM

## 2018-01-10 DIAGNOSIS — Z1239 Encounter for other screening for malignant neoplasm of breast: Secondary | ICD-10-CM | POA: Diagnosis not present

## 2018-01-10 DIAGNOSIS — Z23 Encounter for immunization: Secondary | ICD-10-CM | POA: Diagnosis not present

## 2018-01-10 DIAGNOSIS — Z136 Encounter for screening for cardiovascular disorders: Secondary | ICD-10-CM

## 2018-01-10 DIAGNOSIS — Z0000 Encounter for general adult medical examination without abnormal findings: Secondary | ICD-10-CM

## 2018-01-10 DIAGNOSIS — R5383 Other fatigue: Secondary | ICD-10-CM

## 2018-01-10 DIAGNOSIS — R635 Abnormal weight gain: Secondary | ICD-10-CM

## 2018-01-10 DIAGNOSIS — E559 Vitamin D deficiency, unspecified: Secondary | ICD-10-CM

## 2018-01-10 DIAGNOSIS — Z13220 Encounter for screening for lipoid disorders: Secondary | ICD-10-CM | POA: Diagnosis not present

## 2018-01-10 DIAGNOSIS — Z114 Encounter for screening for human immunodeficiency virus [HIV]: Secondary | ICD-10-CM

## 2018-01-10 NOTE — Progress Notes (Signed)
Patient: Colleen Cox, Female    DOB: [birth year], 47 y.o.   MRN: [id number] Visit Date: 01/10/2018  Today's Provider: Margaretann Loveless, PA-C   Chief Complaint  Patient presents with  . Annual Exam   Subjective:    Annual physical exam Colleen Cox is a 47 y.o. female who presents today for health maintenance and complete physical. She feels well. She reports exercising, walks 30 minutes daily. She reports she is sleeping well. She reports she is sleeping a lot and feels fatigue. -----------------------------------------------------------------   Review of Systems  Constitutional: Positive for fatigue.  HENT: Negative.   Eyes: Negative.   Respiratory: Positive for cough and shortness of breath.   Cardiovascular: Negative.   Gastrointestinal: Positive for constipation.  Endocrine: Negative.   Genitourinary: Negative.   Musculoskeletal: Negative.   Skin: Negative.   Allergic/Immunologic: Positive for environmental allergies.  Neurological: Negative.   Hematological: Negative.   Psychiatric/Behavioral: Negative.     Social History      She  reports that she has never smoked. She has never used smokeless tobacco. She reports that she drinks alcohol. She reports that she does not use drugs.       Social History   Socioeconomic History  . Marital status: Married    Spouse name: Not on file  . Number of children: Not on file  . Years of education: Not on file  . Highest education level: Not on file  Occupational History  . Not on file  Social Needs  . Financial resource strain: Not on file  . Food insecurity:    Worry: Not on file    Inability: Not on file  . Transportation needs:    Medical: Not on file    Non-medical: Not on file  Tobacco Use  . Smoking status: Never Smoker  . Smokeless tobacco: Never Used  Substance and Sexual Activity  . Alcohol use: Yes    Alcohol/week: 0.0 standard drinks    Comment: 1 glass of wine once a week  . Drug use: No    . Sexual activity: Not on file  Lifestyle  . Physical activity:    Days per week: Not on file    Minutes per session: Not on file  . Stress: Not on file  Relationships  . Social connections:    Talks on phone: Not on file    Gets together: Not on file    Attends religious service: Not on file    Active member of club or organization: Not on file    Attends meetings of clubs or organizations: Not on file    Relationship status: Not on file  Other Topics Concern  . Not on file  Social History Narrative  . Not on file    History reviewed. No pertinent past medical history.   Patient Active Problem List   Diagnosis Date Noted  . Allergic rhinitis 01/14/2015  . Family history of diabetes mellitus 01/13/2015    Past Surgical History:  Procedure Laterality Date  . GALLBLADDER SURGERY  2001    Family History        Family Status  Relation Name Status  . Mother  Alive  . Father  Alive        Her family history includes Cirrhosis in her mother; Diabetes in her father and mother; Fibromyalgia in her mother; Heart attack in her father; Heart disease in her father and mother; Liver disease in her mother.  No Known Allergies   Current Outpatient Medications:  .  cetirizine (ZYRTEC) 10 MG tablet, Take 10 mg by mouth daily., Disp: , Rfl:  .  MULTIPLE VITAMIN PO, Take by mouth., Disp: , Rfl:  .  Fluticasone Propionate (FLONASE NA), Place 1 spray into the nose as needed., Disp: , Rfl:    Patient Care Team: Margaretann Loveless, PA-C as PCP - General (Family Medicine)      Objective:   Vitals: BP 128/80 (BP Location: Left Arm, Patient Position: Sitting, Cuff Size: Normal)   Pulse 85   Resp 16   Ht 5\' 3"  (1.6 m)   Wt 147 lb (66.7 kg)   SpO2 97%   BMI 26.04 kg/m    Vitals:   01/10/18 0936  BP: 128/80  Pulse: 85  Resp: 16  SpO2: 97%  Weight: 147 lb (66.7 kg)  Height: 5\' 3"  (1.6 m)     Physical Exam  Constitutional: She is oriented to person, place, and  time. She appears well-developed and well-nourished. No distress.  HENT:  Head: Normocephalic and atraumatic.  Right Ear: Hearing, tympanic membrane, external ear and ear canal normal.  Left Ear: Hearing, tympanic membrane, external ear and ear canal normal.  Nose: Nose normal.  Mouth/Throat: Uvula is midline, oropharynx is clear and moist and mucous membranes are normal. No oropharyngeal exudate.  Eyes: Pupils are equal, round, and reactive to light. Conjunctivae and EOM are normal. Right eye exhibits no discharge. Left eye exhibits no discharge. No scleral icterus.  Neck: Normal range of motion. Neck supple. No JVD present. No tracheal deviation present. No thyromegaly present.  Cardiovascular: Normal rate, regular rhythm, normal heart sounds and intact distal pulses. Exam reveals no gallop and no friction rub.  No murmur heard. Pulmonary/Chest: Effort normal and breath sounds normal. No respiratory distress. She has no wheezes. She has no rales. She exhibits no tenderness.  Abdominal: Soft. Bowel sounds are normal. She exhibits no distension and no mass. There is no tenderness. There is no rebound and no guarding.  Musculoskeletal: Normal range of motion. She exhibits no edema or tenderness.  Lymphadenopathy:    She has no cervical adenopathy.  Neurological: She is alert and oriented to person, place, and time.  Skin: Skin is warm and dry. No rash noted. She is not diaphoretic.  Psychiatric: She has a normal mood and affect. Her behavior is normal. Judgment and thought content normal.  Vitals reviewed.    Depression Screen PHQ 2/9 Scores 01/10/2018 01/03/2017  PHQ - 2 Score 0 0  PHQ- 9 Score 6 -      Assessment & Plan:     Routine Health Maintenance and Physical Exam  Exercise Activities and Dietary recommendations Goals   None     Immunization History  Administered Date(s) Administered  . Hepatitis A, Adult 01/03/2017  . Influenza,inj,Quad PF,6+ Mos 01/03/2017  . Tdap  07/19/2009    Health Maintenance  Topic Date Due  . HIV Screening  10/11/1987  . INFLUENZA VACCINE  11/08/2017  . MAMMOGRAM  01/17/2018  . PAP SMEAR  07/15/2018  . TETANUS/TDAP  07/20/2019     Discussed health benefits of physical activity, and encouraged her to engage in regular exercise appropriate for her age and condition.    1. Annual physical exam Normal physical exam today. Will check labs as below and f/u pending lab results. If labs are stable and WNL she will not need to have these rechecked for one year at her next annual  physical exam. She is to call the office in the meantime if she has any acute issue, questions or concerns. - CBC with Differential/Platelet - Comprehensive metabolic panel - TSH  2. Breast cancer screening Breast exam today was normal. There is no family history of breast cancer. She does perform regular self breast exams. Mammogram was ordered as below. Information for [facility] was given to patient so she may schedule her mammogram at her convenience. - MM 3D SCREEN BREAST BILATERAL; Future  3. Family history of diabetes mellitus Will check labs as below and f/u pending results. - Hemoglobin A1c  4. Encounter for lipid screening for cardiovascular disease Will check labs as below and f/u pending results. - Lipid panel  5. Screening for HIV without presence of risk factors Will check labs as below and f/u pending results. - HIV Antibody (routine testing w rflx)  6. Need for influenza vaccination Flu vaccine given today without complication. Patient sat upright for 15 minutes to check for adverse reaction before being released. - Flu Vaccine QUAD 36+ mos IM  7. Fatigue, unspecified type Unsure of source for excessive fatigue. Patient does have h/o Vit D def and iron def. Will check these labs as below. Also having weight gain and irregular menses. Will check FSH/LH as below to see if perimenopausal. I will f/u pending lab results.    - TSH - Vitamin D (25 hydroxy) - B12 and Folate Panel - FSH/LH - Fe+TIBC+Fer  8. Vitamin D deficiency See above medical treatment plan. - Vitamin D (25 hydroxy) - Fe+TIBC+Fer  9. Weight gain See above medical treatment plan. - B12 and Folate Panel - FSH/LH - Fe+TIBC+Fer  --------------------------------------------------------------------    Margaretann Loveless, PA-C  [facility]

## 2018-01-10 NOTE — Patient Instructions (Signed)

## 2018-01-11 ENCOUNTER — Other Ambulatory Visit: Payer: Self-pay | Admitting: Physician Assistant

## 2018-01-12 LAB — COMPREHENSIVE METABOLIC PANEL
ALBUMIN: 4.7 g/dL (ref 3.5–5.5)
ALK PHOS: 64 IU/L (ref 39–117)
ALT: 32 IU/L (ref 0–32)
AST: 23 IU/L (ref 0–40)
Albumin/Globulin Ratio: 2.4 — ABNORMAL HIGH (ref 1.2–2.2)
BILIRUBIN TOTAL: 0.5 mg/dL (ref 0.0–1.2)
BUN/Creatinine Ratio: 12 (ref 9–23)
BUN: 8 mg/dL (ref 6–24)
CO2: 22 mmol/L (ref 20–29)
CREATININE: 0.65 mg/dL (ref 0.57–1.00)
Calcium: 9.2 mg/dL (ref 8.7–10.2)
Chloride: 104 mmol/L (ref 96–106)
GFR calc non Af Amer: 108 mL/min/{1.73_m2} (ref >59)
GFR, EST AFRICAN AMERICAN: 124 mL/min/{1.73_m2} (ref >59)
GLOBULIN, TOTAL: 2 g/dL (ref 1.5–4.5)
GLUCOSE: 97 mg/dL (ref 65–99)
Potassium: 4 mmol/L (ref 3.5–5.2)
SODIUM: 141 mmol/L (ref 134–144)
TOTAL PROTEIN: 6.7 g/dL (ref 6.0–8.5)

## 2018-01-12 LAB — LIPID PANEL
CHOL/HDL RATIO: 3.7 ratio (ref 0.0–4.4)
CHOLESTEROL TOTAL: 191 mg/dL (ref 100–199)
HDL: 52 mg/dL (ref >39)
LDL CALC: 120 mg/dL — AB (ref 0–99)
Triglycerides: 97 mg/dL (ref 0–149)
VLDL CHOLESTEROL CAL: 19 mg/dL (ref 5–40)

## 2018-01-12 LAB — CBC WITH DIFFERENTIAL/PLATELET
BASOS ABS: 0.1 10*3/uL (ref 0.0–0.2)
BASOS: 1 %
EOS (ABSOLUTE): 0.1 10*3/uL (ref 0.0–0.4)
Eos: 1 %
HEMOGLOBIN: 14.8 g/dL (ref 11.1–15.9)
Hematocrit: 42 % (ref 34.0–46.6)
Immature Grans (Abs): 0 10*3/uL (ref 0.0–0.1)
Immature Granulocytes: 0 %
LYMPHS ABS: 2.1 10*3/uL (ref 0.7–3.1)
Lymphs: 36 %
MCH: 31.5 pg (ref 26.6–33.0)
MCHC: 35.2 g/dL (ref 31.5–35.7)
MCV: 89 fL (ref 79–97)
MONOCYTES: 12 %
Monocytes Absolute: 0.7 10*3/uL (ref 0.1–0.9)
Neutrophils Absolute: 2.9 10*3/uL (ref 1.4–7.0)
Neutrophils: 50 %
Platelets: 265 10*3/uL (ref 150–450)
RBC: 4.7 x10E6/uL (ref 3.77–5.28)
RDW: 12 % — ABNORMAL LOW (ref 12.3–15.4)
WBC: 5.7 10*3/uL (ref 3.4–10.8)

## 2018-01-12 LAB — HIV ANTIBODY (ROUTINE TESTING W REFLEX): HIV SCREEN 4TH GENERATION: NONREACTIVE

## 2018-01-12 LAB — B12 AND FOLATE PANEL
Folate: 10.5 ng/mL (ref >3.0)
Vitamin B-12: 548 pg/mL (ref 232–1245)

## 2018-01-12 LAB — TSH: TSH: 2.28 u[IU]/mL (ref 0.450–4.500)

## 2018-01-12 LAB — FSH/LH
FSH: 4.1 m[IU]/mL
LH: 13.5 m[IU]/mL

## 2018-01-12 LAB — IRON,TIBC AND FERRITIN PANEL
FERRITIN: 67 ng/mL (ref 15–150)
IRON SATURATION: 49 % (ref 15–55)
Iron: 154 ug/dL (ref 27–159)
TIBC: 312 ug/dL (ref 250–450)
UIBC: 158 ug/dL (ref 131–425)

## 2018-01-12 LAB — VITAMIN D 25 HYDROXY (VIT D DEFICIENCY, FRACTURES): VIT D 25 HYDROXY: 25.7 ng/mL — AB (ref 30.0–100.0)

## 2018-01-12 LAB — HEMOGLOBIN A1C
Est. average glucose Bld gHb Est-mCnc: 103 mg/dL
Hgb A1c MFr Bld: 5.2 % (ref 4.8–5.6)

## 2018-01-14 ENCOUNTER — Telehealth: Payer: Self-pay

## 2018-01-14 DIAGNOSIS — E559 Vitamin D deficiency, unspecified: Secondary | ICD-10-CM

## 2018-01-14 MED ORDER — VITAMIN D (ERGOCALCIFEROL) 1.25 MG (50000 UNIT) PO CAPS: 50000.0000 [IU] | ORAL_CAPSULE | ORAL | 0 refills | Status: AC

## 2018-01-14 NOTE — Telephone Encounter (Signed)
Viewed by Jari Pigg on 01/14/2018 9:10 AM   Per patient she has been taking 2000 IU of Vitamin D for the past 3 months.

## 2018-01-14 NOTE — Telephone Encounter (Signed)
Ok I will send in the high dose Vit D for her to take once weekly. After taking this for 3 months she can go back to the OTC dose.

## 2018-01-14 NOTE — Telephone Encounter (Signed)
Patient advised as directed below.  Thanks,  -Imer Foxworth 

## 2018-01-14 NOTE — Telephone Encounter (Signed)
-----   Message from Margaretann Loveless, [place] sent at 01/14/2018  9:05 AM EDT ----- All labs are within normal limits and stable with exception of Vit D which is still borderline low. Will you check your vitamins and see how much Vit D is included in them please and let me know? Thanks! -JB

## 2018-02-25 ENCOUNTER — Ambulatory Visit
Admission: RE | Admit: 2018-02-25 | Discharge: 2018-02-25 | Disposition: A | Discharge status: Home / Self Care | Payer: Managed Care, Other (non HMO) | Source: Ambulatory Visit | Attending: Physician Assistant | Admitting: Physician Assistant

## 2018-02-25 DIAGNOSIS — Z1239 Encounter for other screening for malignant neoplasm of breast: Secondary | ICD-10-CM | POA: Insufficient documentation

## 2018-02-27 ENCOUNTER — Telehealth: Payer: Self-pay

## 2018-02-27 NOTE — Telephone Encounter (Signed)
-----   Message from Jennifer M Burnette, PA-C sent at 02/27/2018  1:29 PM EST ----- Normal mammogram. Repeat screening in one year. 

## 2018-02-27 NOTE — Telephone Encounter (Signed)
LMTCB or to view results through my chart 

## 2018-02-27 NOTE — Telephone Encounter (Signed)
Viewed by Jari Piggracie K Valdez on 02/27/2018 1:53 PM

## 2018-04-07 ENCOUNTER — Other Ambulatory Visit: Payer: Self-pay | Admitting: Physician Assistant

## 2018-04-07 DIAGNOSIS — E559 Vitamin D deficiency, unspecified: Secondary | ICD-10-CM

## 2018-04-08 NOTE — Telephone Encounter (Signed)
Do not refill. Patient needs to have Vitamin D level rechecked.

## 2020-02-18 IMAGING — MG DIGITAL SCREENING BILATERAL MAMMOGRAM WITH TOMO AND CAD
8 series · 9 of 24 positions shown · non-contrast
Comparison: Previous exam(s).

CLINICAL DATA: Screening.

EXAM:
DIGITAL SCREENING BILATERAL MAMMOGRAM WITH TOMO AND CAD

[R MLO synth-2D]
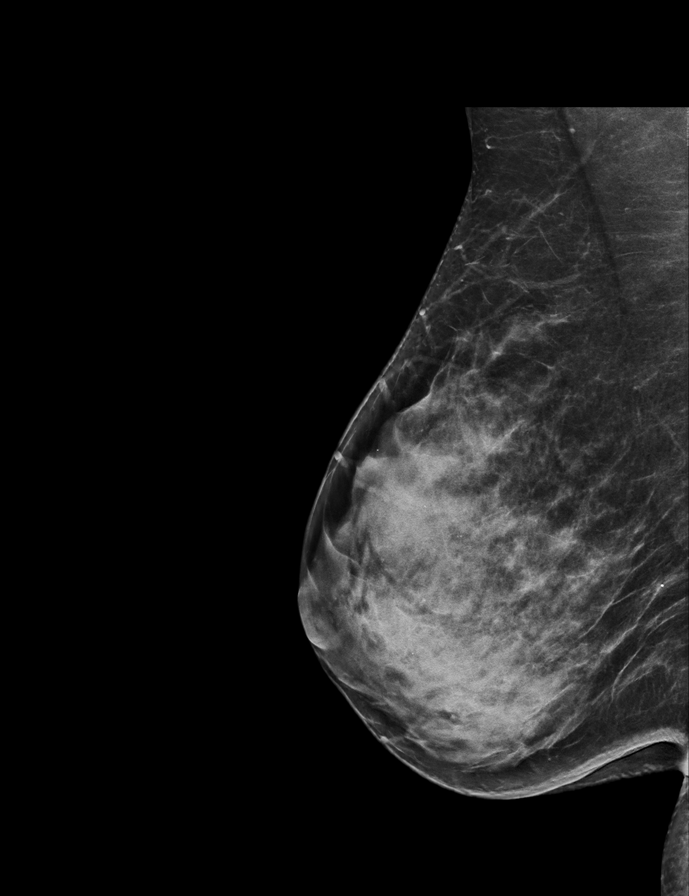

[L CC synth-2D]
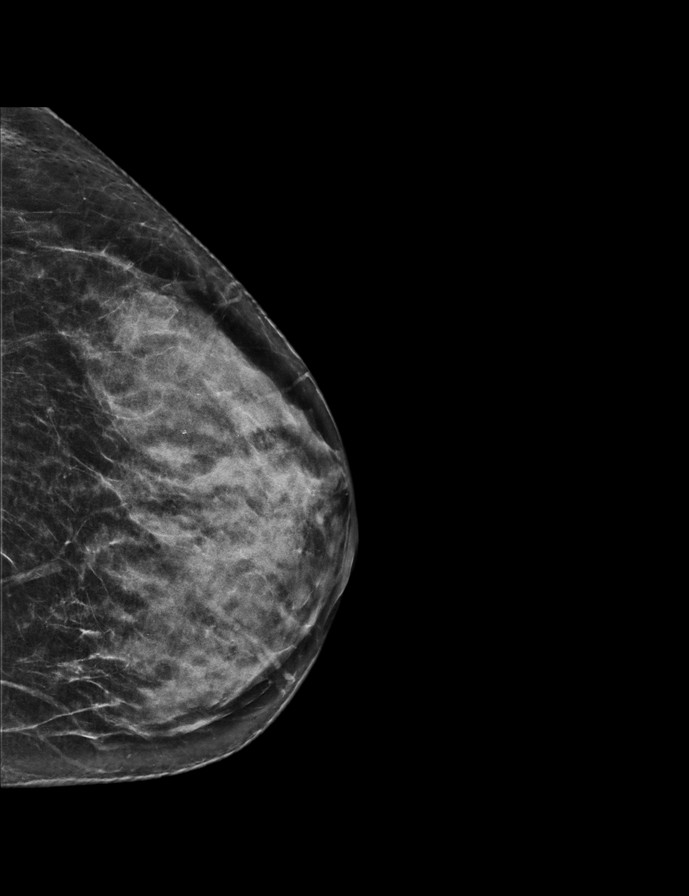

[L MLO synth-2D]
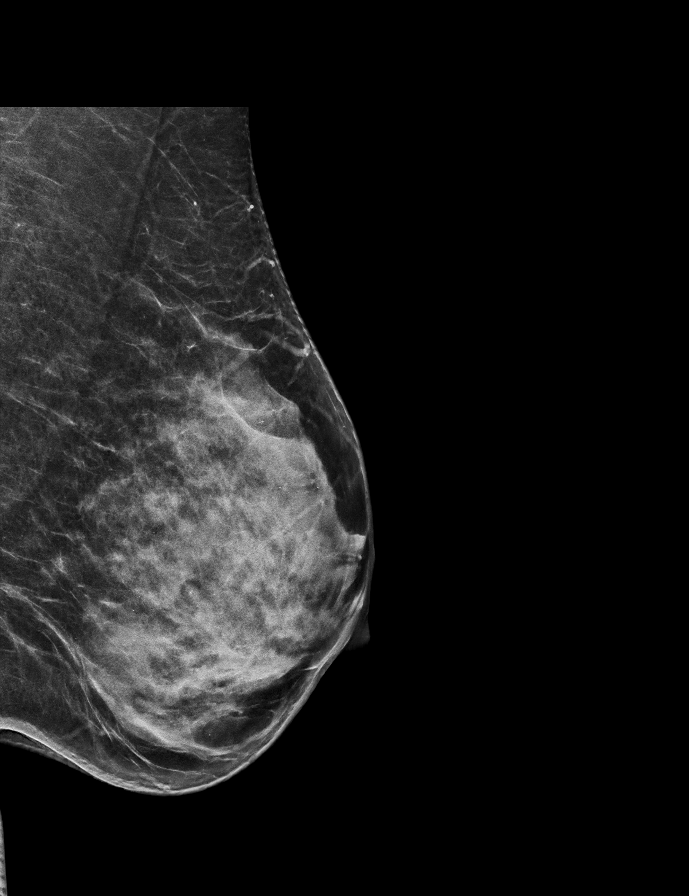

[R CC synth-2D]
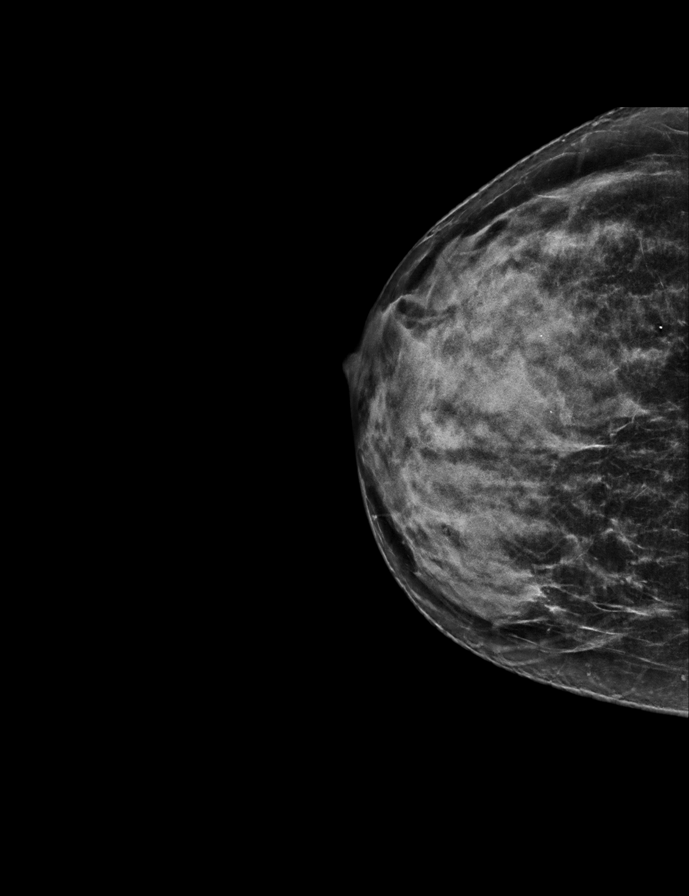

[L CC tomo · 2 of 57 frames shown]
[frame 19/57]
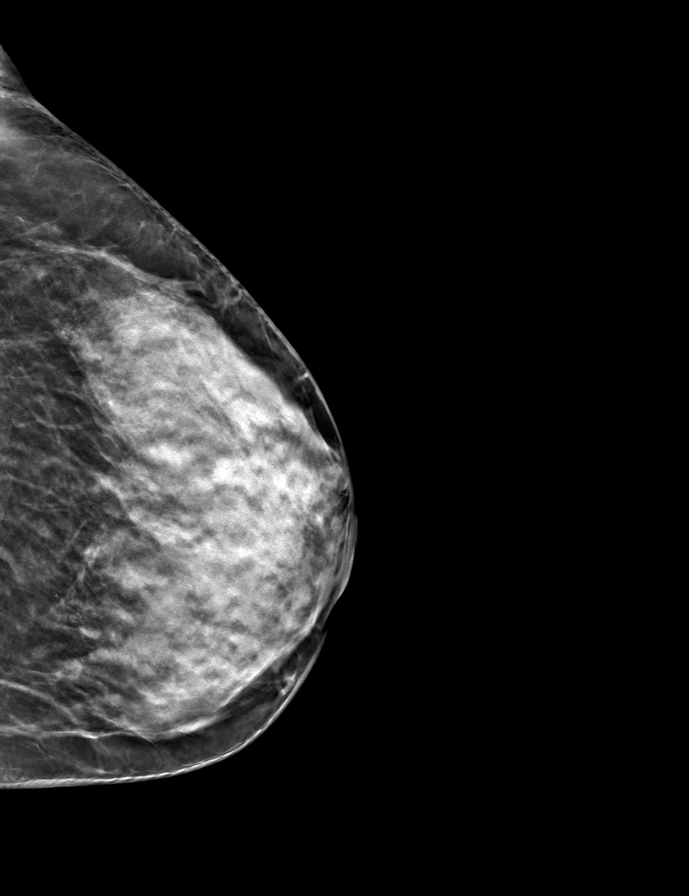
[frame 29/57]
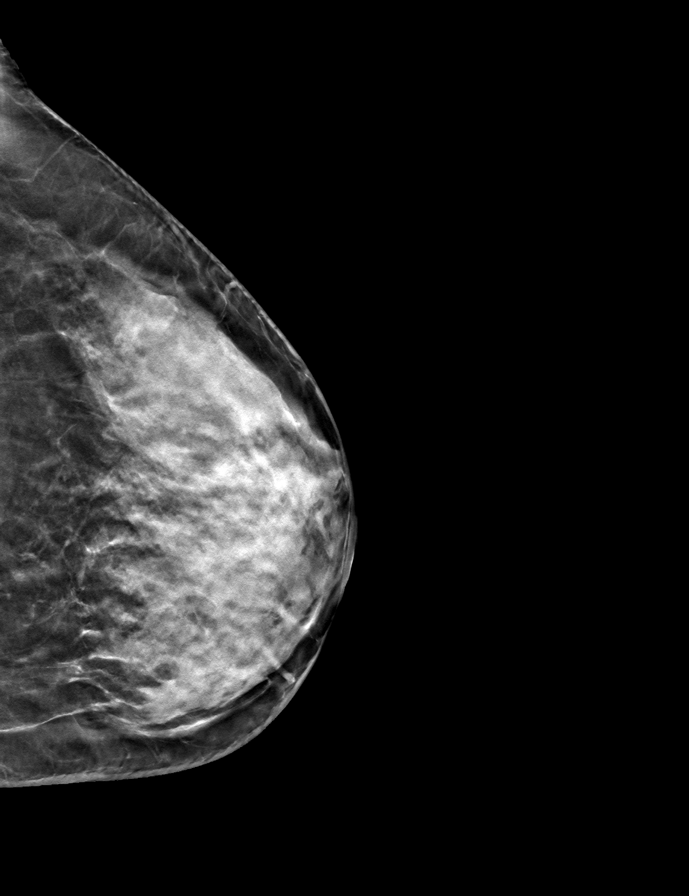

[L MLO tomo · tomo slice 31/60.0]
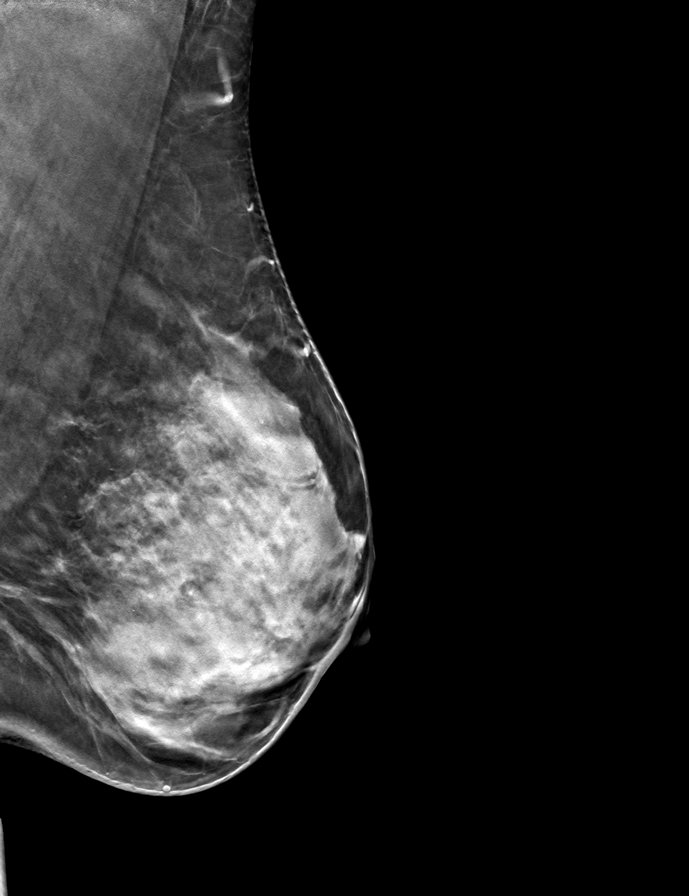

[R CC tomo · tomo slice 28/55.0]
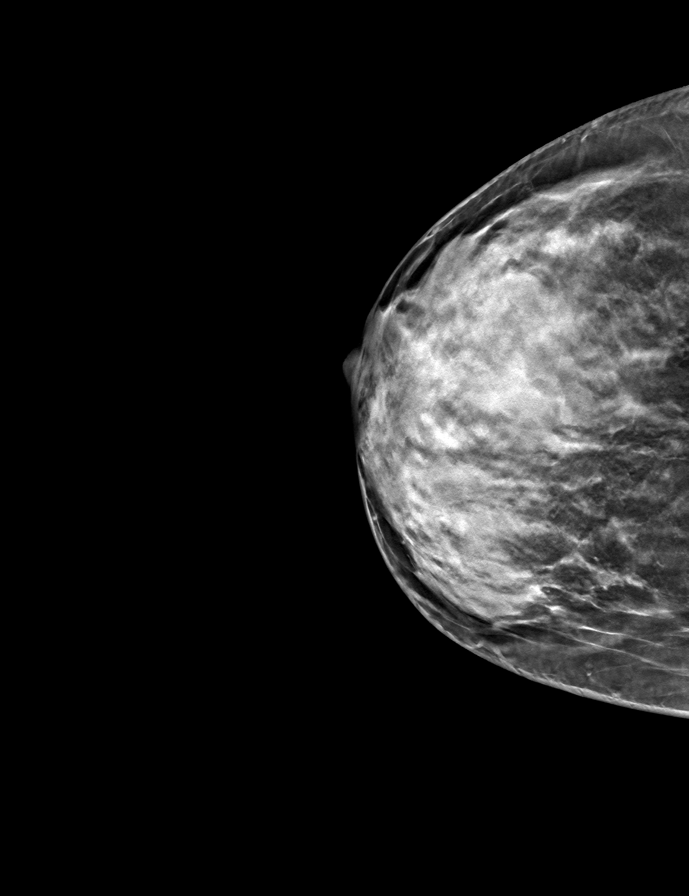

[R MLO tomo · tomo slice 32/63.0]
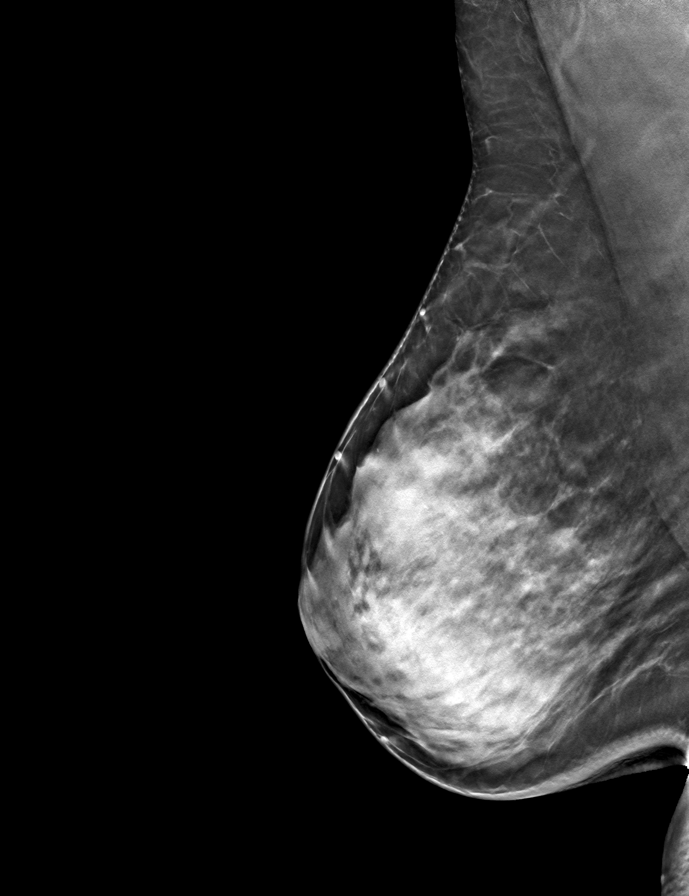

[9 of 24 positions shown; findings below may reference images not displayed]

ACR Breast Density Category c: The breast tissue is heterogeneously
dense, which may obscure small masses.
FINDINGS: There are no findings suspicious for malignancy. Images were
processed with CAD.
IMPRESSION: No mammographic evidence of malignancy. A result letter of this
screening mammogram will be mailed directly to the patient.

RECOMMENDATION:
Screening mammogram in one year. (Code:[id number]-[id number])

BI-RADS CATEGORY  1: Negative.
